# Patient Record
Sex: Female | Born: 1940 | Race: Black or African American | Hispanic: No | Smoking: Never smoker
Health system: Southern US, Community
[De-identification: ages and names within clinical notes are randomized; demographics above are authoritative.]

## PROBLEM LIST (undated history)

## (undated) DIAGNOSIS — E7800 Pure hypercholesterolemia, unspecified: Secondary | ICD-10-CM

## (undated) DIAGNOSIS — I10 Essential (primary) hypertension: Secondary | ICD-10-CM

---

## 2021-01-29 ENCOUNTER — Emergency Department (HOSPITAL_BASED_OUTPATIENT_CLINIC_OR_DEPARTMENT_OTHER): Payer: Medicare Other

## 2021-01-29 ENCOUNTER — Emergency Department (HOSPITAL_BASED_OUTPATIENT_CLINIC_OR_DEPARTMENT_OTHER)
Admission: EM | Admit: 2021-01-29 | Discharge: 2021-01-29 | Disposition: A | Discharge status: Home / Self Care | Payer: Medicare Other | Attending: Emergency Medicine | Admitting: Emergency Medicine

## 2021-01-29 ENCOUNTER — Other Ambulatory Visit: Payer: Self-pay

## 2021-01-29 ENCOUNTER — Encounter (HOSPITAL_BASED_OUTPATIENT_CLINIC_OR_DEPARTMENT_OTHER): Payer: Self-pay | Admitting: *Deleted

## 2021-01-29 DIAGNOSIS — Z79899 Other long term (current) drug therapy: Secondary | ICD-10-CM | POA: Insufficient documentation

## 2021-01-29 DIAGNOSIS — Z20822 Contact with and (suspected) exposure to covid-19: Secondary | ICD-10-CM | POA: Insufficient documentation

## 2021-01-29 DIAGNOSIS — I10 Essential (primary) hypertension: Secondary | ICD-10-CM | POA: Insufficient documentation

## 2021-01-29 DIAGNOSIS — J069 Acute upper respiratory infection, unspecified: Secondary | ICD-10-CM | POA: Insufficient documentation

## 2021-01-29 DIAGNOSIS — Z7982 Long term (current) use of aspirin: Secondary | ICD-10-CM | POA: Insufficient documentation

## 2021-01-29 DIAGNOSIS — R519 Headache, unspecified: Secondary | ICD-10-CM | POA: Insufficient documentation

## 2021-01-29 DIAGNOSIS — R059 Cough, unspecified: Secondary | ICD-10-CM | POA: Diagnosis present

## 2021-01-29 HISTORY — DX: Pure hypercholesterolemia, unspecified: E78.00

## 2021-01-29 HISTORY — DX: Essential (primary) hypertension: I10

## 2021-01-29 LAB — CBC WITH DIFFERENTIAL/PLATELET
Abs Immature Granulocytes: 0.02 10*3/uL (ref 0.00–0.07)
Basophils Absolute: 0 10*3/uL (ref 0.0–0.1)
Basophils Relative: 1 %
Eosinophils Absolute: 0 10*3/uL (ref 0.0–0.5)
Eosinophils Relative: 0 %
HCT: 45.1 % (ref 36.0–46.0)
Hemoglobin: 14.9 g/dL (ref 12.0–15.0)
Immature Granulocytes: 0 %
Lymphocytes Relative: 16 %
Lymphs Abs: 1 10*3/uL (ref 0.7–4.0)
MCH: 30.6 pg (ref 26.0–34.0)
MCHC: 33 g/dL (ref 30.0–36.0)
MCV: 92.6 fL (ref 80.0–100.0)
Monocytes Absolute: 0.3 10*3/uL (ref 0.1–1.0)
Monocytes Relative: 6 %
Neutro Abs: 4.8 10*3/uL (ref 1.7–7.7)
Neutrophils Relative %: 77 %
Platelets: 391 10*3/uL (ref 150–400)
RBC: 4.87 MIL/uL (ref 3.87–5.11)
RDW: 13.9 % (ref 11.5–15.5)
WBC: 6.1 10*3/uL (ref 4.0–10.5)
nRBC: 0 % (ref 0.0–0.2)

## 2021-01-29 LAB — BASIC METABOLIC PANEL
Anion gap: 8 (ref 5–15)
BUN: 11 mg/dL (ref 8–23)
CO2: 25 mmol/L (ref 22–32)
Calcium: 8.8 mg/dL — ABNORMAL LOW (ref 8.9–10.3)
Chloride: 105 mmol/L (ref 98–111)
Creatinine, Ser: 0.74 mg/dL (ref 0.44–1.00)
GFR, Estimated: >60 mL/min (ref >60)
Glucose, Bld: 119 mg/dL — ABNORMAL HIGH (ref 70–99)
Potassium: 3.7 mmol/L (ref 3.5–5.1)
Sodium: 138 mmol/L (ref 135–145)

## 2021-01-29 LAB — RESP PANEL BY RT-PCR (FLU A&B, COVID) ARPGX2
Influenza A by PCR: NEGATIVE
Influenza B by PCR: NEGATIVE
SARS Coronavirus 2 by RT PCR: NEGATIVE

## 2021-01-29 MED ORDER — DIPHENHYDRAMINE HCL 50 MG/ML IJ SOLN
12.5000 mg | Freq: Once | INTRAMUSCULAR | Status: AC
Start: 2021-01-29 — End: 2021-01-29
  Administered 2021-01-29: 12.5 mg via INTRAVENOUS
  Filled 2021-01-29: qty 1

## 2021-01-29 MED ORDER — PROCHLORPERAZINE EDISYLATE 10 MG/2ML IJ SOLN
5.0000 mg | Freq: Once | INTRAMUSCULAR | Status: AC
  Administered 2021-01-29: 5 mg via INTRAVENOUS
  Filled 2021-01-29: qty 2

## 2021-01-29 NOTE — ED Provider Notes (Signed)
[facility] Provider Note   CSN: [id number] Arrival date & time: 01/29/21  1608     History  Chief Complaint  Patient presents with   Cough    Jillian Allen is a 81 y.o. female.  Presents to the emergency room with concern for cough and headache.  Has been having cough, congestion the past few days.  Cough is now productive with mostly clear sputum.  No blood.  No chills or fevers, no difficulty breathing or chest pain.  This afternoon she developed headache, thunderclap.  But did seem to come on suddenly.  Has had a headache previously like this but normally does not get headaches.  8 out of 10 in severity, frontal, aching, nonradiating.  No associated numbness, weakness, speech or vision change.  No known sick contacts.  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 Authorizing Provider  amLODipine-benazepril (LOTREL) 10-20 MG capsule Take 1 capsule by mouth daily. 11/15/19  Yes [provider]  aspirin 81 MG EC tablet Take by mouth. 09/30/15  Yes [provider]  Cholecalciferol 25 MCG (1000 UT) tablet Take by mouth.   Yes [provider]      Allergies    Patient has no known allergies.    Review of Systems   Review of Systems  Constitutional:  Negative for chills and fever.  HENT:  Positive for congestion. Negative for ear pain and sore throat.   Eyes:  Negative for pain and visual disturbance.  Respiratory:  Positive for cough. Negative for shortness of breath.   Cardiovascular:  Negative for chest pain and palpitations.  Gastrointestinal:  Negative for abdominal pain and vomiting.  Genitourinary:  Negative for dysuria and hematuria.  Musculoskeletal:  Negative for arthralgias and back pain.  Skin:  Negative for  color change and rash.  Neurological:  Positive for headaches. Negative for seizures and syncope.  All other systems reviewed and are negative.  Physical Exam Updated Vital Signs BP (!) 145/84    Pulse 87    Temp 98.5 F (36.9 C) (Oral)    Resp (!) 23    Ht 5\' 1"  (1.549 m)    Wt 72.6 kg    LMP  (LMP Unknown)    SpO2 95%    BMI 30.23 kg/m  Physical Exam Vitals and nursing note reviewed.  Constitutional:      General: She is not in acute distress.    Appearance: She is well-developed.  HENT:     Head: Normocephalic and atraumatic.  Eyes:     Conjunctiva/sclera: Conjunctivae normal.  Cardiovascular:     Rate and Rhythm: Normal rate and regular rhythm.     Heart sounds: No murmur heard. Pulmonary:     Effort: Pulmonary effort is normal. No respiratory distress.     Breath sounds: Normal breath sounds.  Abdominal:     Palpations: Abdomen is soft.     Tenderness: There is no abdominal tenderness.  Musculoskeletal:        General: No swelling.     Cervical back: Neck supple.  Skin:    General: Skin is warm and dry.     Capillary Refill: Capillary refill takes less than 2 seconds.  Neurological:     General: No focal deficit present.  Mental Status: She is alert and oriented to person, place, and time.  Psychiatric:        Mood and Affect: Mood normal.    ED Results / Procedures / Treatments   Labs (all labs ordered are listed, but only abnormal results are displayed) Labs Reviewed  BASIC METABOLIC PANEL - Abnormal; Notable for the following components:      Result Value   Glucose, Bld 119 (*)    Calcium 8.8 (*)    All other components within normal limits  RESP PANEL BY RT-PCR (FLU A&B, COVID) [id number]  CBC WITH DIFFERENTIAL/PLATELET    EKG None  Radiology CT Head Wo Contrast  Result Date: 01/29/2021 CLINICAL DATA:  Headache, new or worsening EXAM: CT HEAD WITHOUT CONTRAST TECHNIQUE: Contiguous axial images were obtained from the base of the skull through the  vertex without intravenous contrast. COMPARISON:  None. FINDINGS: Brain: No evidence of acute infarction, hemorrhage, cerebral edema, mass, mass effect, or midline shift. No hydrocephalus or extra-axial fluid collection. Degree of cerebral volume loss is within normal limits for age. Vascular: No hyperdense vessel. Skull: Normal. Negative for fracture or focal lesion. Sinuses/Orbits: No acute finding. Status post right lens replacement. Other: The mastoid air cells are well aerated. IMPRESSION: IMPRESSION No acute intracranial process. Electronically Signed   By: Merilyn Baba M.D.   On: 01/29/2021 18:55   DG Chest Portable 1 View  Result Date: 01/29/2021 CLINICAL DATA:  Cough EXAM: PORTABLE CHEST 1 VIEW COMPARISON:  None. FINDINGS: The heart size and mediastinal contours are within normal limits. Both lungs are clear. The visualized skeletal structures are unremarkable. IMPRESSION: No active disease. Electronically Signed   By: Donavan Foil M.D.   On: 01/29/2021 17:16    Procedures Procedures    Medications Ordered in ED Medications  prochlorperazine (COMPAZINE) injection 5 mg (5 mg Intravenous Given 01/29/21 1828)  diphenhydrAMINE (BENADRYL) injection 12.5 mg (12.5 mg Intravenous Given 01/29/21 1828)    ED Course/ Medical Decision Making/ A&P                           Medical Decision Making  81 year old lady presenting to ER with concern for cough, congestion, headache.  On physical exam she appears well in no acute distress, vital signs are stable, noted hypertension.  Chest x-ray per my review and per radiology is negative for acute process, no evidence for pneumonia.  Basic labs including CBC and BMP were unremarkable.  COVID and flu testing is negative.  Given new headache, severity of headache, checked CT head to rule out [facility] or other acute intracranial process.  No acute findings on CT head.  Based on symptomatology, suspect most likely viral process.  On reassessment after patient has  cocktail, headache Resolved.  Blood pressure also greatly improved.  Given her well appearance, stable vital signs, believe she is appropriate for discharge and outpatient management.  Recommend follow-up with primary doctor.  After the discussed management above, the patient was determined to be safe for discharge.  The patient was in agreement with this plan and all questions regarding their care were answered.  ED return precautions were discussed and the patient will return to the ED with any significant worsening of condition.         Final Clinical Impression(s) / ED Diagnoses Final diagnoses:  Nonintractable headache, unspecified chronicity pattern, unspecified headache type  Viral URI with cough    Rx / DC Orders ED Discharge Orders  None         Lucrezia Starch, MD 01/29/21 [phone]

## 2021-01-29 NOTE — Discharge Instructions (Signed)
Please follow-up with your primary care doctor.  Recommend taking Tylenol, Motrin as needed for pain control.  For your congestion recommend trying Mucinex.  If you develop difficulty breathing, chest pain or other new concerning symptom, come back to ER for reassessment.

## 2021-01-29 NOTE — ED Notes (Signed)
Presents with cough and congestion for the past 3 days, states having prod cough of thin clear sputum. Denies any fevers. States she has covid vaccination and booster shots.

## 2021-01-29 NOTE — ED Triage Notes (Signed)
Cough x 3 days. Headache and dizziness after eating lunch.

## 2023-01-01 IMAGING — CT CT HEAD W/O CM
3 series · 16 of 47 positions shown, 19 images · non-contrast
Comparison: None.

CLINICAL DATA: Headache, new or worsening

EXAM:
CT HEAD WITHOUT CONTRAST
TECHNIQUE: Contiguous axial images were obtained from the base of the skull
through the vertex without intravenous contrast.

[Series 2: head (person_name) (person_name) · axial · 0.42mm/px · z∈[+507,+632]mm · 10 of 31 slices shown, 13 images]
[im 3/31  brain]
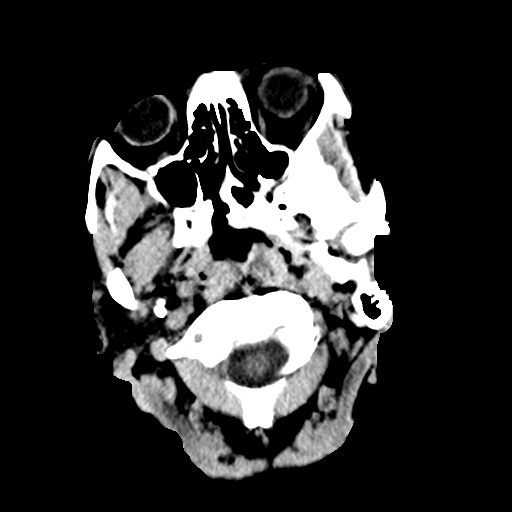
[im 3/31  bone]
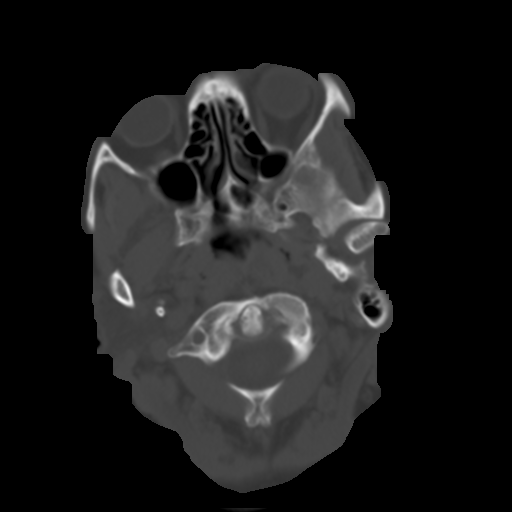
[im 6/31  brain]
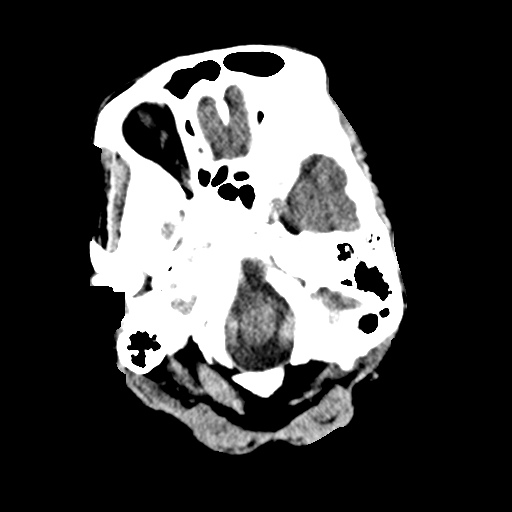
[im 9/31  brain]
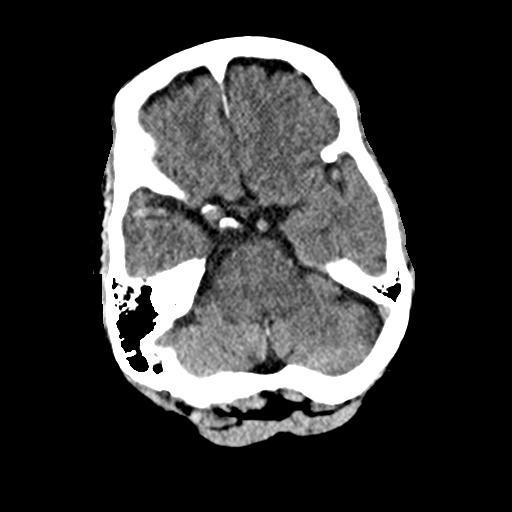
[im 11/31  brain]
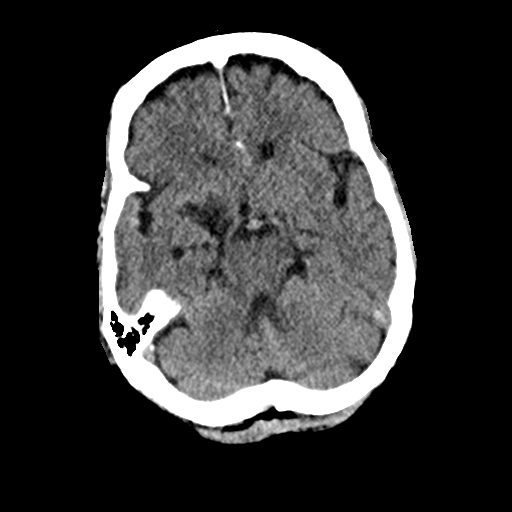
[im 14/31  brain]
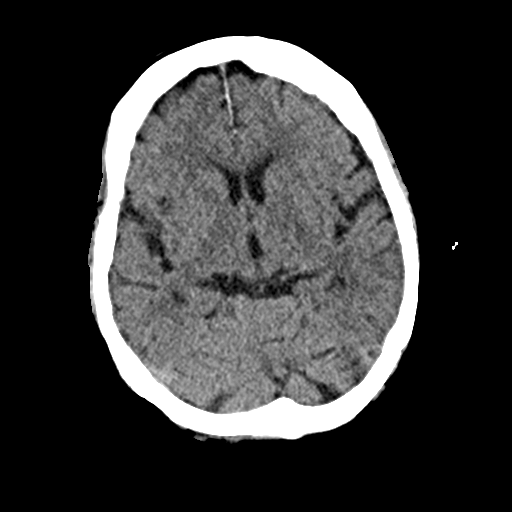
[im 14/31  bone]
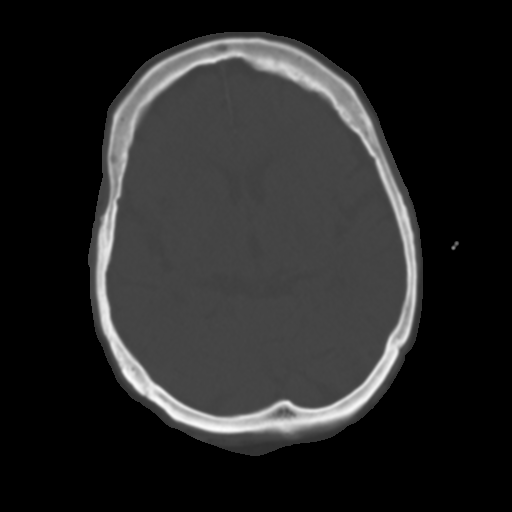
[im 17/31  brain]
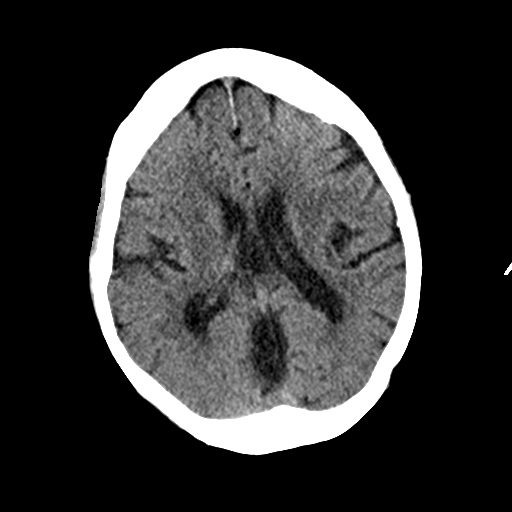
[im 20/31  brain]
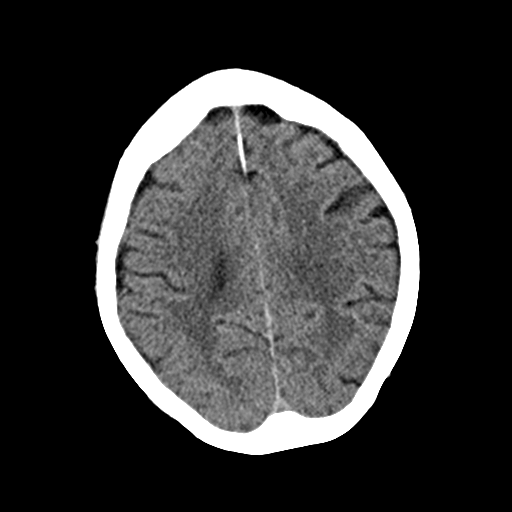
[im 23/31  brain]
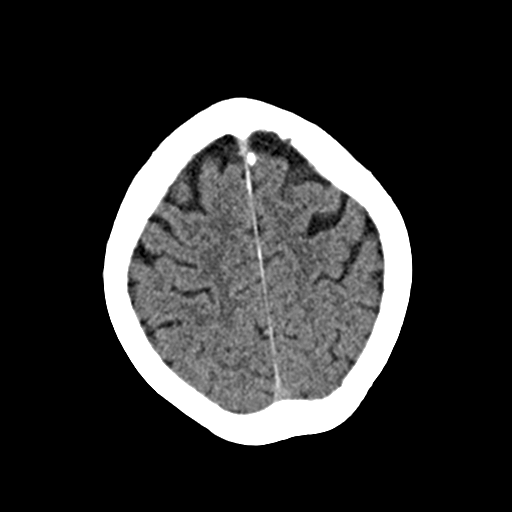
[im 25/31  brain]
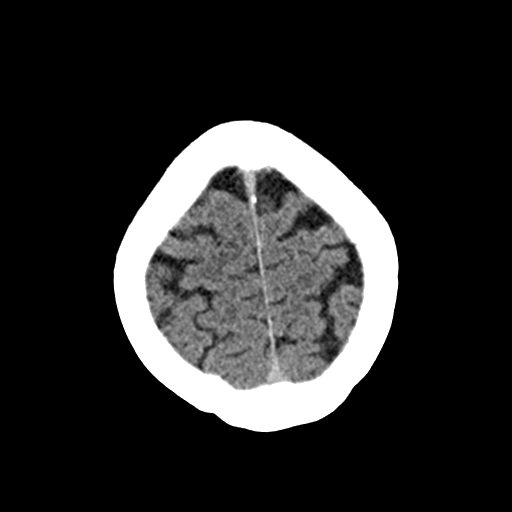
[im 25/31  bone]
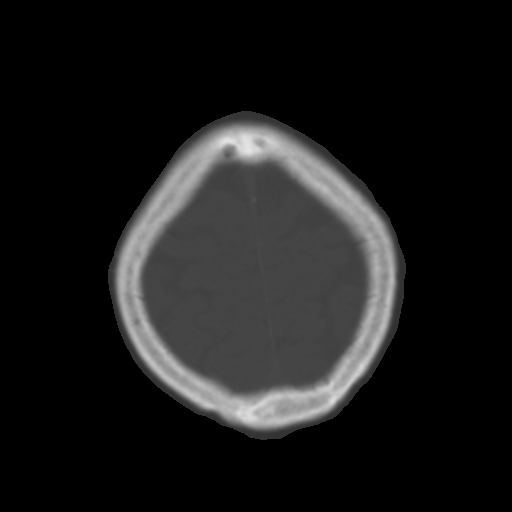
[im 28/31  brain]
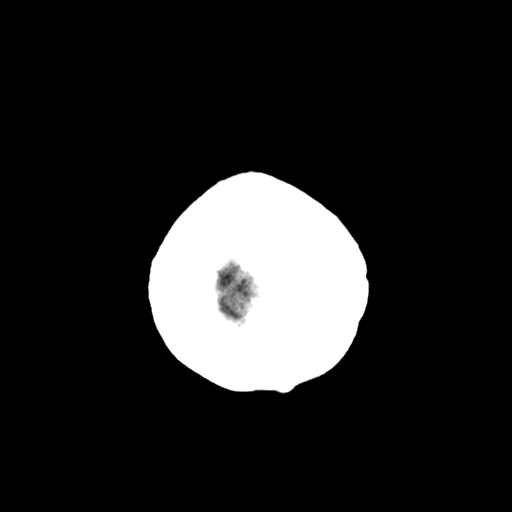

[Series 4: coronal soft · coronal · 0.31mm/px · 3 of 68 slices shown]
[im 23/68  brain]
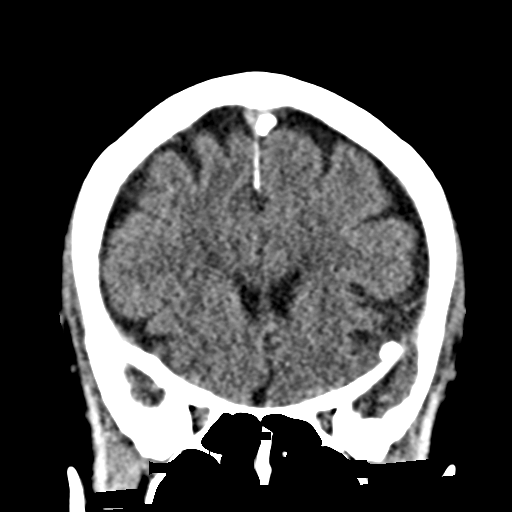
[im 30/68  brain]
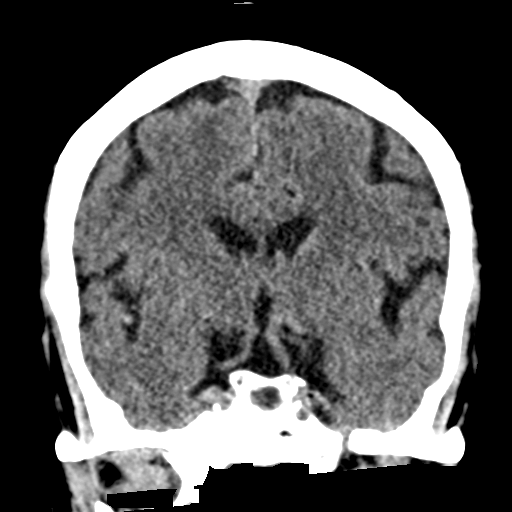
[im 38/68  brain]
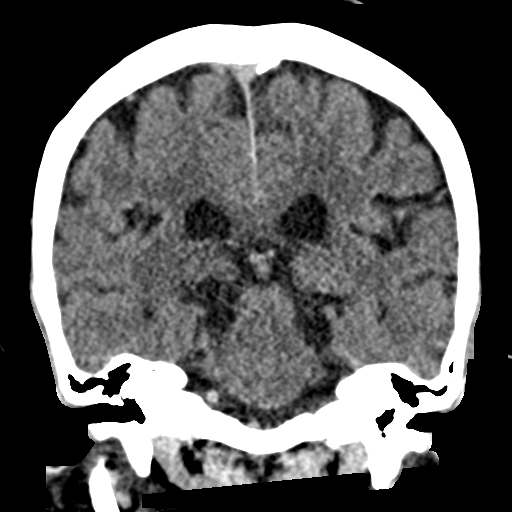

[Series 5: sag soft · sagittal · 0.33mm/px · 3 of 53 slices shown]
[im 18/53  brain]
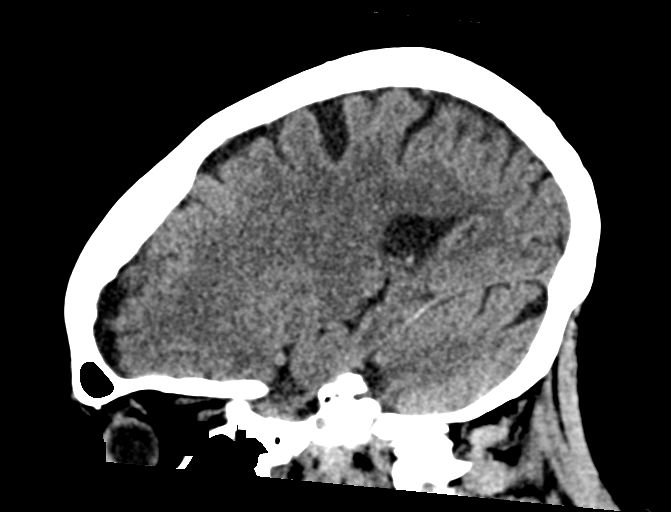
[im 27/53  brain]
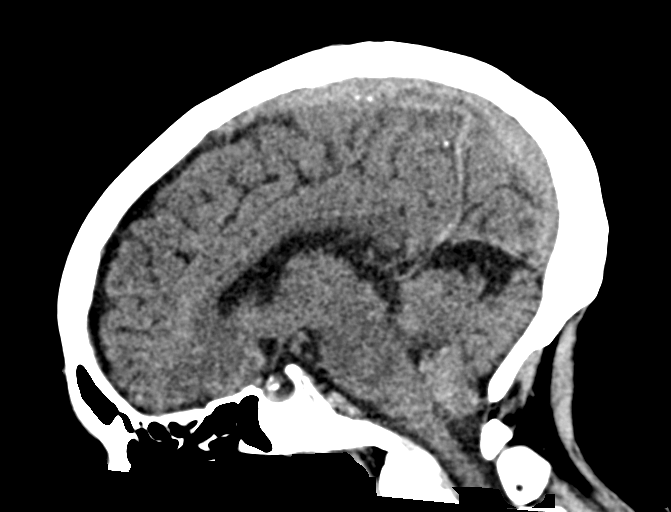
[im 35/53  brain]
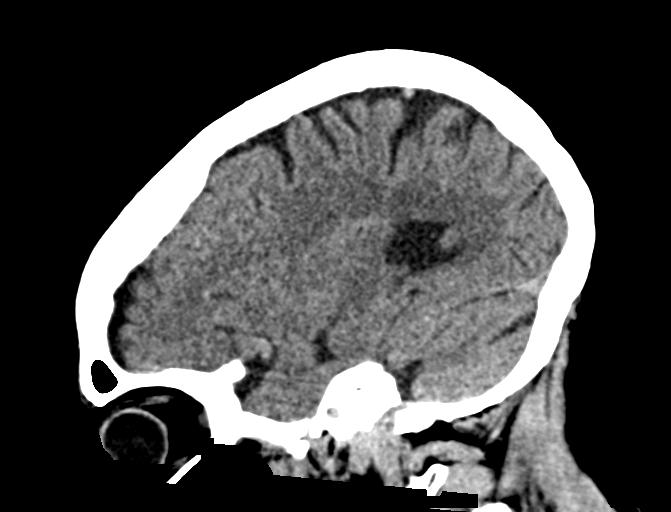

[16 of 47 positions shown; findings below may reference images not displayed]

FINDINGS: Brain: No evidence of acute infarction, hemorrhage, cerebral edema,
mass, mass effect, or midline shift. No hydrocephalus or extra-axial
fluid collection. Degree of cerebral volume loss is within normal
limits for age.

Vascular: No hyperdense vessel.

Skull: Normal. Negative for fracture or focal lesion.

Sinuses/Orbits: No acute finding. Status post right lens
replacement.

Other: The mastoid air cells are well aerated.
IMPRESSION: IMPRESSION
No acute intracranial process.

## 2023-01-01 IMAGING — DX DG CHEST 1V PORT
1 series · 1 of 1 positions shown · non-contrast
Comparison: None.

CLINICAL DATA: Cough

EXAM:
PORTABLE CHEST 1 VIEW

[chest ap]
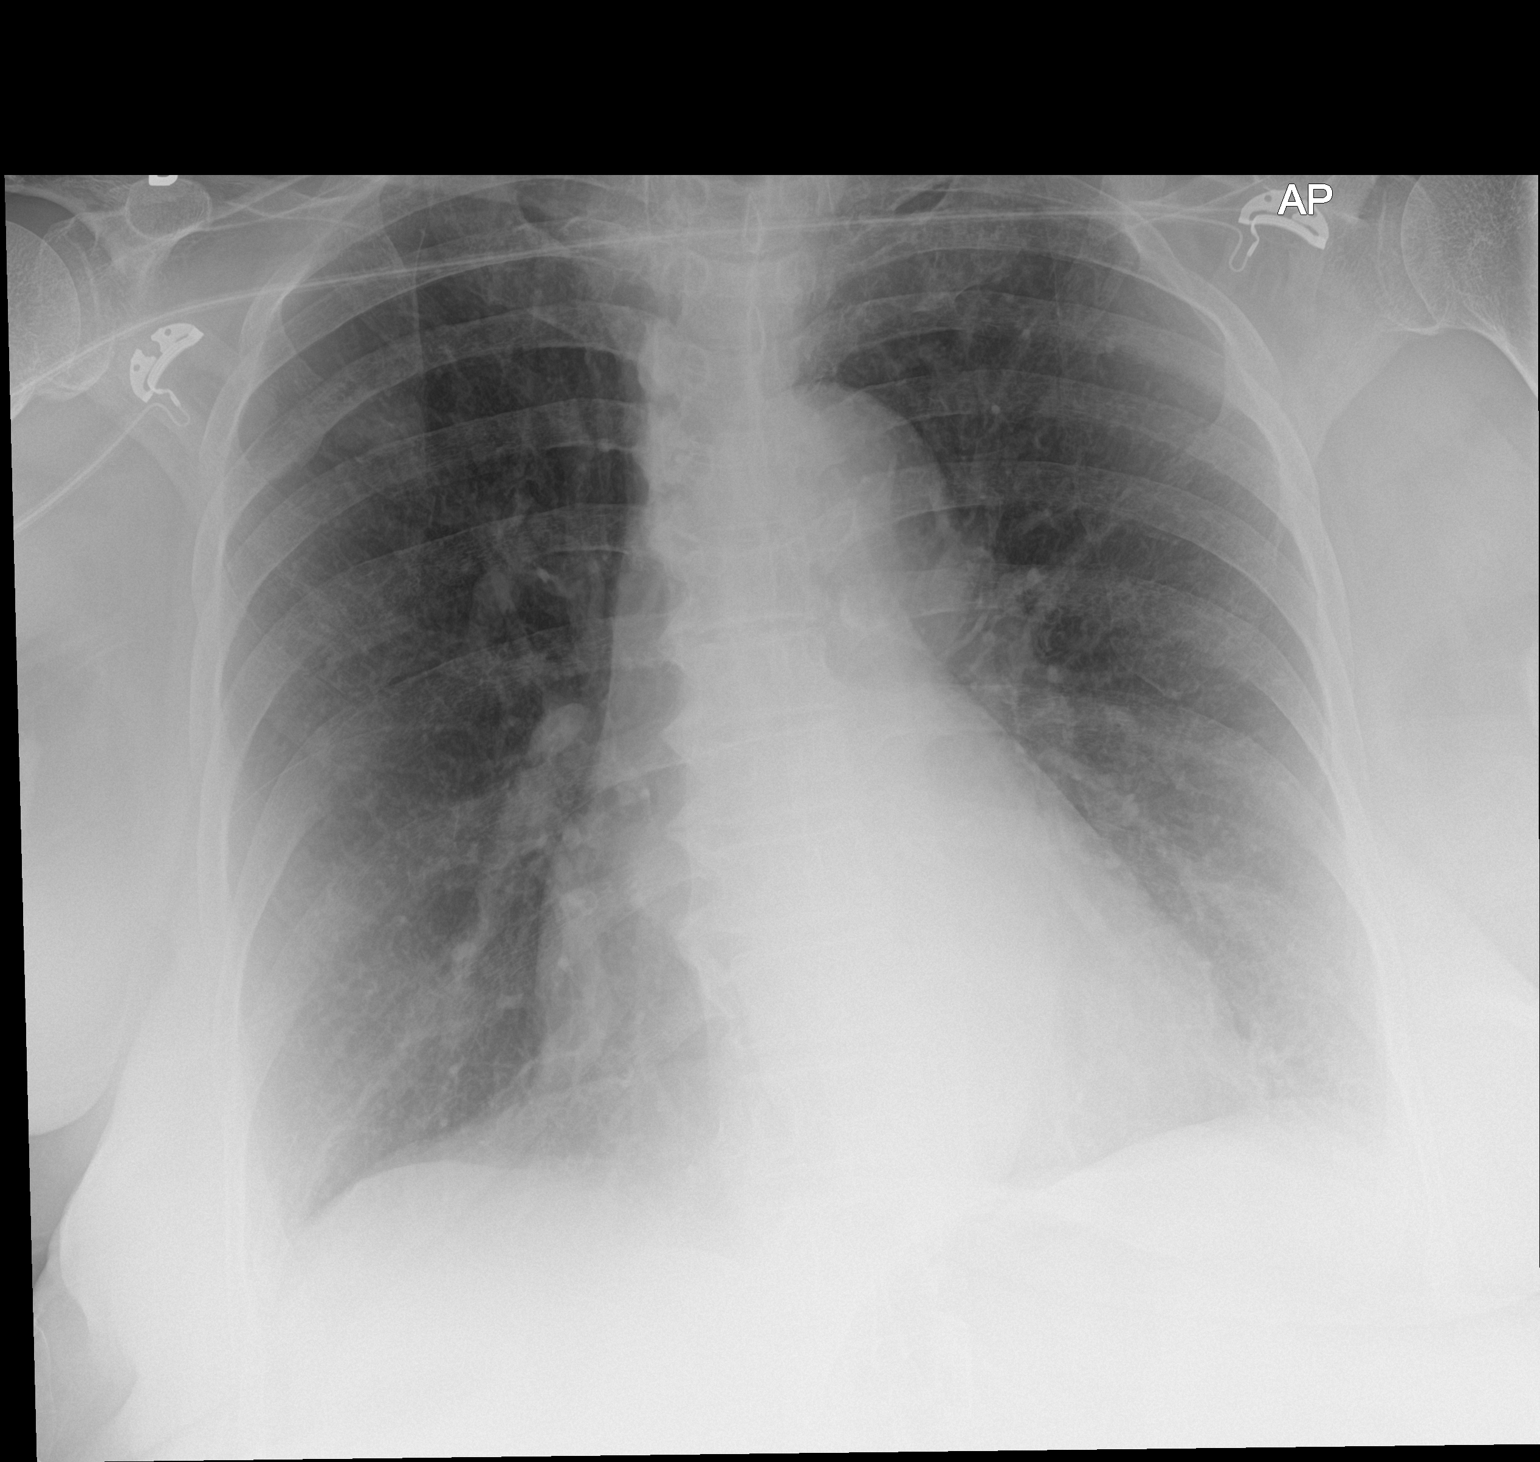

[1 of 1 positions shown; findings below may reference images not displayed]

FINDINGS: The heart size and mediastinal contours are within normal limits.
Both lungs are clear. The visualized skeletal structures are
unremarkable.
IMPRESSION: No active disease.
